# Patient Record
Sex: Male | Born: 1985 | Race: Black or African American | Hispanic: No | Marital: Single | Smoking: Current every day smoker
Health system: Southern US, Community
[De-identification: ages and names within clinical notes are randomized; demographics above are authoritative.]

---

## 2017-08-19 DIAGNOSIS — Z23 Encounter for immunization: Secondary | ICD-10-CM | POA: Diagnosis not present

## 2018-02-28 ENCOUNTER — Ambulatory Visit
Admission: EM | Admit: 2018-02-28 | Discharge: 2018-02-28 | Disposition: A | Discharge status: Home / Self Care | Payer: 59 | Attending: Family Medicine | Admitting: Family Medicine

## 2018-02-28 ENCOUNTER — Other Ambulatory Visit: Payer: Self-pay

## 2018-02-28 ENCOUNTER — Encounter: Payer: Self-pay | Admitting: Emergency Medicine

## 2018-02-28 DIAGNOSIS — R05 Cough: Secondary | ICD-10-CM | POA: Diagnosis not present

## 2018-02-28 DIAGNOSIS — Z8349 Family history of other endocrine, nutritional and metabolic diseases: Secondary | ICD-10-CM | POA: Diagnosis not present

## 2018-02-28 DIAGNOSIS — Z8249 Family history of ischemic heart disease and other diseases of the circulatory system: Secondary | ICD-10-CM | POA: Insufficient documentation

## 2018-02-28 DIAGNOSIS — E669 Obesity, unspecified: Secondary | ICD-10-CM | POA: Diagnosis not present

## 2018-02-28 DIAGNOSIS — R0789 Other chest pain: Secondary | ICD-10-CM | POA: Diagnosis not present

## 2018-02-28 DIAGNOSIS — F17200 Nicotine dependence, unspecified, uncomplicated: Secondary | ICD-10-CM | POA: Insufficient documentation

## 2018-02-28 DIAGNOSIS — F17210 Nicotine dependence, cigarettes, uncomplicated: Secondary | ICD-10-CM | POA: Diagnosis not present

## 2018-02-28 DIAGNOSIS — Z6836 Body mass index (BMI) 36.0-36.9, adult: Secondary | ICD-10-CM | POA: Insufficient documentation

## 2018-02-28 DIAGNOSIS — R079 Chest pain, unspecified: Secondary | ICD-10-CM | POA: Diagnosis present

## 2018-02-28 MED ORDER — IBUPROFEN 800 MG PO TABS: 800.0000 mg | ORAL_TABLET | Freq: Three times a day (TID) | ORAL | 0 refills | Status: DC

## 2018-02-28 MED ORDER — TRAMADOL HCL 50 MG PO TABS: 50.0000 mg | ORAL_TABLET | Freq: Three times a day (TID) | ORAL | 0 refills | Status: DC | PRN

## 2018-02-28 NOTE — ED Triage Notes (Signed)
Patient in today c/o cough x this morning and pain when he takes a deep breath. Patient also states he is having chest pain on the left side of chest that goes around to his back and shoulder. Patient denies nausea, light headedness or sob.

## 2018-02-28 NOTE — Discharge Instructions (Signed)
Medications as prescribed. ° °Rest. ° °Take care ° °Dr. Louvenia Golomb  °

## 2018-02-28 NOTE — ED Provider Notes (Signed)
[facility]    CSN: [id number] Arrival date & time: 02/28/18  0805  History   Chief Complaint Chief Complaint  Patient presents with  . Cough  . Chest Pain   HPI  32 year old male presents with the above complaints.  Patient states that he woke this morning and had chest pain and pain with deep inspiration.  He has a cough as well.  Patient works with [employer] and has recently done some heavy lifting.  He describes the pain as a pressure.  He also has pain around the left scapula.  No shortness of breath.  No fever.  No nausea, diaphoresis.  Worse with movement and deep inspiration.  Pain is moderate to severe.  No other associated symptoms.  No other complaints.  PMH - Obesity, Tobacco abuse.  Surgical Hx - No past surgeries.  Home Medications    Prior to Admission medications   Medication Sig Start Date End Date Taking? Authorizing Provider  ibuprofen (ADVIL,MOTRIN) 800 MG tablet Take 1 tablet (800 mg total) by mouth 3 (three) times daily. 02/28/18   Tommie Samsook, Nathaly Dawkins G, DO  traMADol (ULTRAM) 50 MG tablet Take 1 tablet (50 mg total) by mouth every 8 (eight) hours as needed. 02/28/18   Tommie Samsook, Clifton Safley G, DO   Family History Family History  Problem Relation Age of Onset  . Thyroid disease Mother   . Hypertension Father    Social History Social History   Tobacco Use  . Smoking status: Current Every Day Smoker  . Smokeless tobacco: Never Used  Substance Use Topics  . Alcohol use: Yes    Comment: socially  . Drug use: Never   Allergies   Patient has no known allergies.  Review of Systems Review of Systems  Constitutional: Negative.   Respiratory:       Pleuritic pain.  Cardiovascular: Positive for chest pain.    Physical Exam Triage Vital Signs ED Triage Vitals [02/28/18 0821]  Enc Vitals Group     BP (!) 138/101     Pulse Rate 75     Resp 16     Temp 98.2 F (36.8 C)     Temp Source Oral     SpO2 99 %     Weight (!) 307 lb (139.3 kg)     Height 6\' 5"   (1.956 m)     Head Circumference      Peak Flow      Pain Score 6     Pain Loc      Pain Edu?      Excl. in GC?    Updated Vital Signs BP (!) 138/101 (BP Location: Left Arm) Comment: lg cuff  Pulse 75   Temp 98.2 F (36.8 C) (Oral)   Resp 16   Ht 6\' 5"  (1.956 m)   Wt (!) 307 lb (139.3 kg)   SpO2 99%   BMI 36.40 kg/m   Physical Exam  Constitutional: He is oriented to person, place, and time. He appears well-developed. No distress.  HENT:  Head: Normocephalic and atraumatic.  Eyes: Conjunctivae are normal.  Cardiovascular: Normal rate and regular rhythm.  Pulmonary/Chest: Effort normal and breath sounds normal. He has no wheezes. He has no rales. He exhibits tenderness.  Musculoskeletal:  Patient with tenderness around the inferior border of the scapula.  Neurological: He is alert and oriented to person, place, and time.  Psychiatric: He has a normal mood and affect. His behavior is normal.  Nursing note and vitals reviewed.  UC Treatments / Results  Labs (all labs ordered are listed, but only abnormal results are displayed) Labs Reviewed - No data to display  EKG Interpretation: Normal sinus rhythm with rate of 71.  Normal axis.  Normal intervals.  No ST or T wave changes.  Normal EKG.  Radiology No results found.  Procedures Procedures (including critical care time)  Medications Ordered in UC Medications - No data to display   Initial Impression / Assessment and Plan / UC Course  I have reviewed the triage vital signs and the nursing notes.  Pertinent labs & imaging results that were available during my care of the patient were reviewed by me and considered in my medical decision making (see chart for details).     32 year old male presents with chest wall pain/MSK pain.  EKG normal.  Treating with ibuprofen and tramadol.  Final Clinical Impressions(s) / UC Diagnoses   Final diagnoses:  Chest wall pain    ED Discharge Orders        Ordered     ibuprofen (ADVIL,MOTRIN) 800 MG tablet  3 times daily     02/28/18 0845    traMADol (ULTRAM) 50 MG tablet  Every 8 hours PRN     02/28/18 0845      Controlled Substance Prescriptions [place] Controlled Substance Registry consulted? Not Applicable   Tommie Sams, [place] 02/28/18 [phone]

## 2018-05-23 ENCOUNTER — Ambulatory Visit
Admission: RE | Admit: 2018-05-23 | Discharge: 2018-05-23 | Disposition: A | Discharge status: Home / Self Care | Payer: No Typology Code available for payment source | Source: Ambulatory Visit | Attending: Physician Assistant | Admitting: Physician Assistant

## 2018-05-23 ENCOUNTER — Other Ambulatory Visit: Payer: Self-pay | Admitting: Physician Assistant

## 2018-05-23 DIAGNOSIS — M25572 Pain in left ankle and joints of left foot: Secondary | ICD-10-CM

## 2018-05-23 DIAGNOSIS — M7732 Calcaneal spur, left foot: Secondary | ICD-10-CM | POA: Diagnosis not present

## 2018-09-13 DIAGNOSIS — Z23 Encounter for immunization: Secondary | ICD-10-CM | POA: Diagnosis not present

## 2018-09-14 DIAGNOSIS — M545 Low back pain: Secondary | ICD-10-CM | POA: Diagnosis not present

## 2018-09-27 DIAGNOSIS — F17200 Nicotine dependence, unspecified, uncomplicated: Secondary | ICD-10-CM | POA: Diagnosis not present

## 2018-10-12 DIAGNOSIS — R2689 Other abnormalities of gait and mobility: Secondary | ICD-10-CM | POA: Diagnosis not present

## 2018-10-12 DIAGNOSIS — M545 Low back pain: Secondary | ICD-10-CM | POA: Diagnosis not present

## 2018-10-24 DIAGNOSIS — F17200 Nicotine dependence, unspecified, uncomplicated: Secondary | ICD-10-CM | POA: Diagnosis not present

## 2018-10-25 DIAGNOSIS — F17200 Nicotine dependence, unspecified, uncomplicated: Secondary | ICD-10-CM | POA: Diagnosis not present

## 2018-11-10 DIAGNOSIS — M48061 Spinal stenosis, lumbar region without neurogenic claudication: Secondary | ICD-10-CM | POA: Diagnosis not present

## 2018-11-10 DIAGNOSIS — M5126 Other intervertebral disc displacement, lumbar region: Secondary | ICD-10-CM | POA: Diagnosis not present

## 2018-11-10 DIAGNOSIS — K760 Fatty (change of) liver, not elsewhere classified: Secondary | ICD-10-CM | POA: Diagnosis not present

## 2018-11-11 DIAGNOSIS — R2689 Other abnormalities of gait and mobility: Secondary | ICD-10-CM | POA: Diagnosis not present

## 2018-12-11 DIAGNOSIS — R2689 Other abnormalities of gait and mobility: Secondary | ICD-10-CM | POA: Diagnosis not present

## 2019-01-10 DIAGNOSIS — R2689 Other abnormalities of gait and mobility: Secondary | ICD-10-CM | POA: Diagnosis not present

## 2019-02-09 DIAGNOSIS — R2689 Other abnormalities of gait and mobility: Secondary | ICD-10-CM | POA: Diagnosis not present

## 2019-03-12 DIAGNOSIS — R5383 Other fatigue: Secondary | ICD-10-CM | POA: Diagnosis not present

## 2019-07-20 ENCOUNTER — Other Ambulatory Visit: Payer: Self-pay

## 2019-07-20 DIAGNOSIS — Z20822 Contact with and (suspected) exposure to covid-19: Secondary | ICD-10-CM

## 2019-07-22 LAB — NOVEL CORONAVIRUS, NAA: SARS-CoV-2, NAA: NOT DETECTED

## 2019-08-01 ENCOUNTER — Other Ambulatory Visit: Payer: Self-pay

## 2019-08-01 DIAGNOSIS — Z20822 Contact with and (suspected) exposure to covid-19: Secondary | ICD-10-CM

## 2019-08-02 LAB — NOVEL CORONAVIRUS, NAA: SARS-CoV-2, NAA: NOT DETECTED

## 2019-09-19 IMAGING — CR DG FOOT COMPLETE 3+V*L*
3 series · 3 of 3 positions shown · non-contrast
Comparison: None.

CLINICAL DATA: 32-year-old who sustained a twisting injury to the
LEFT ankle while stepping on a rock. Pain, swelling and bruising to
the LATERAL malleolus and in the LATERAL foot. Initial encounter.

EXAM:
LEFT FOOT - COMPLETE 3+ VIEW

[foot ap]
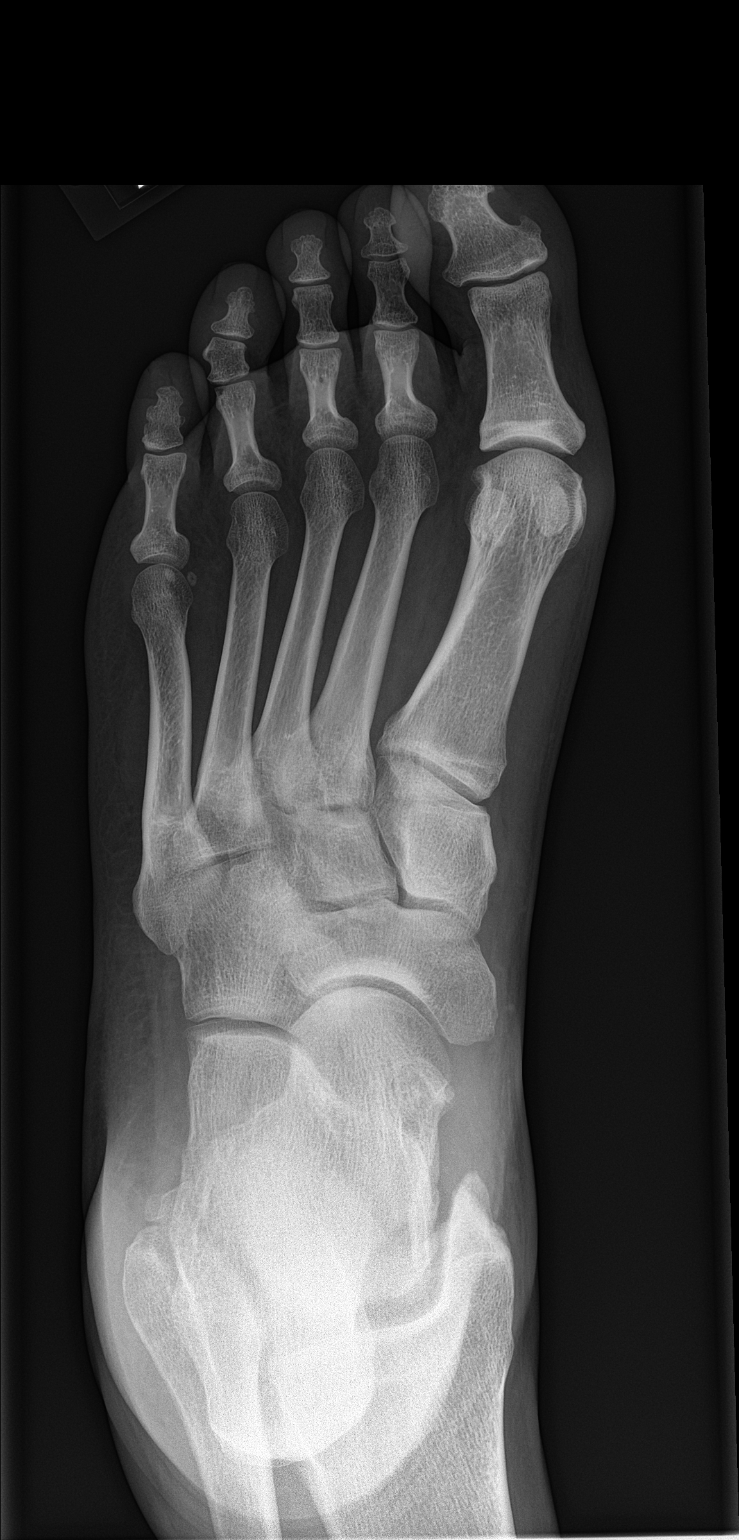

[foot obl]
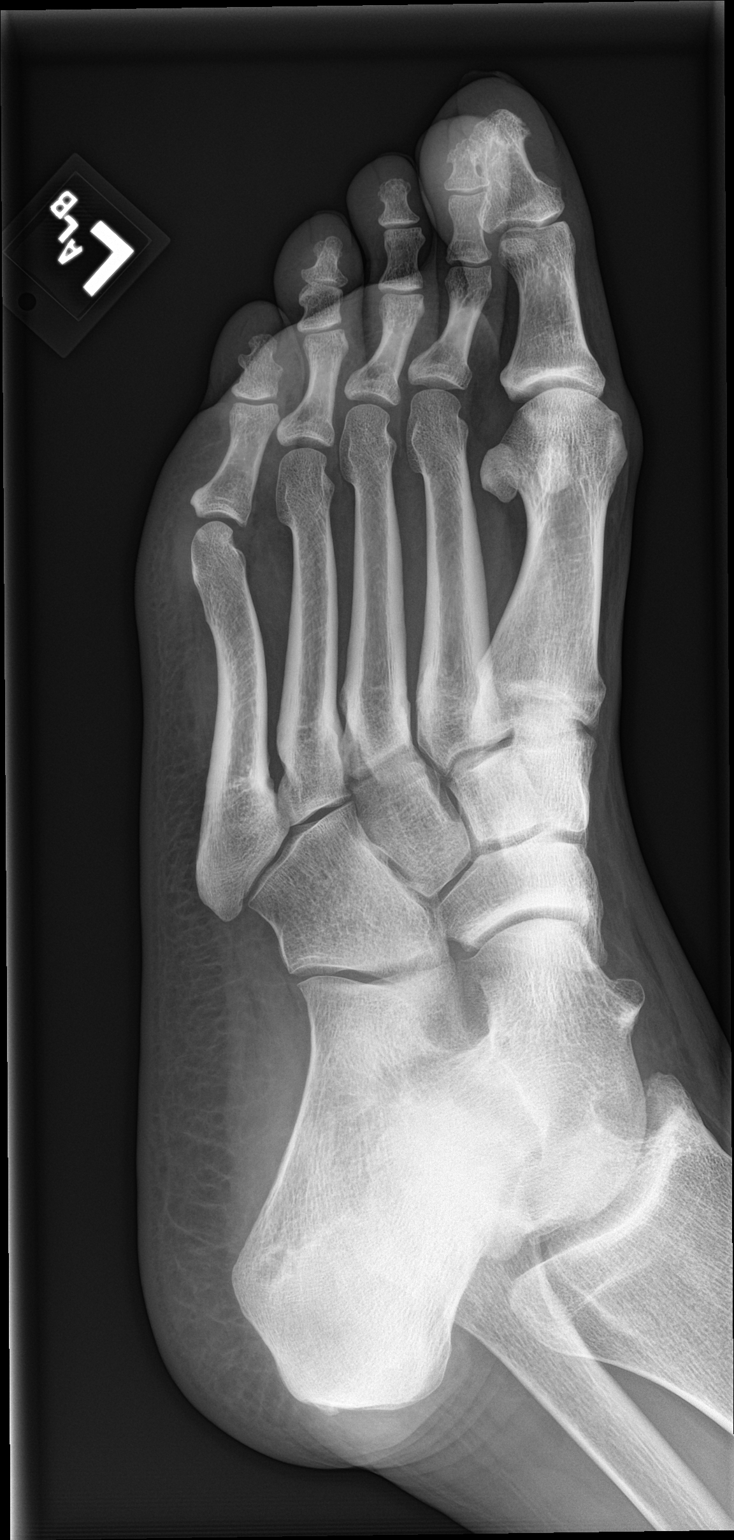

[foot lat]
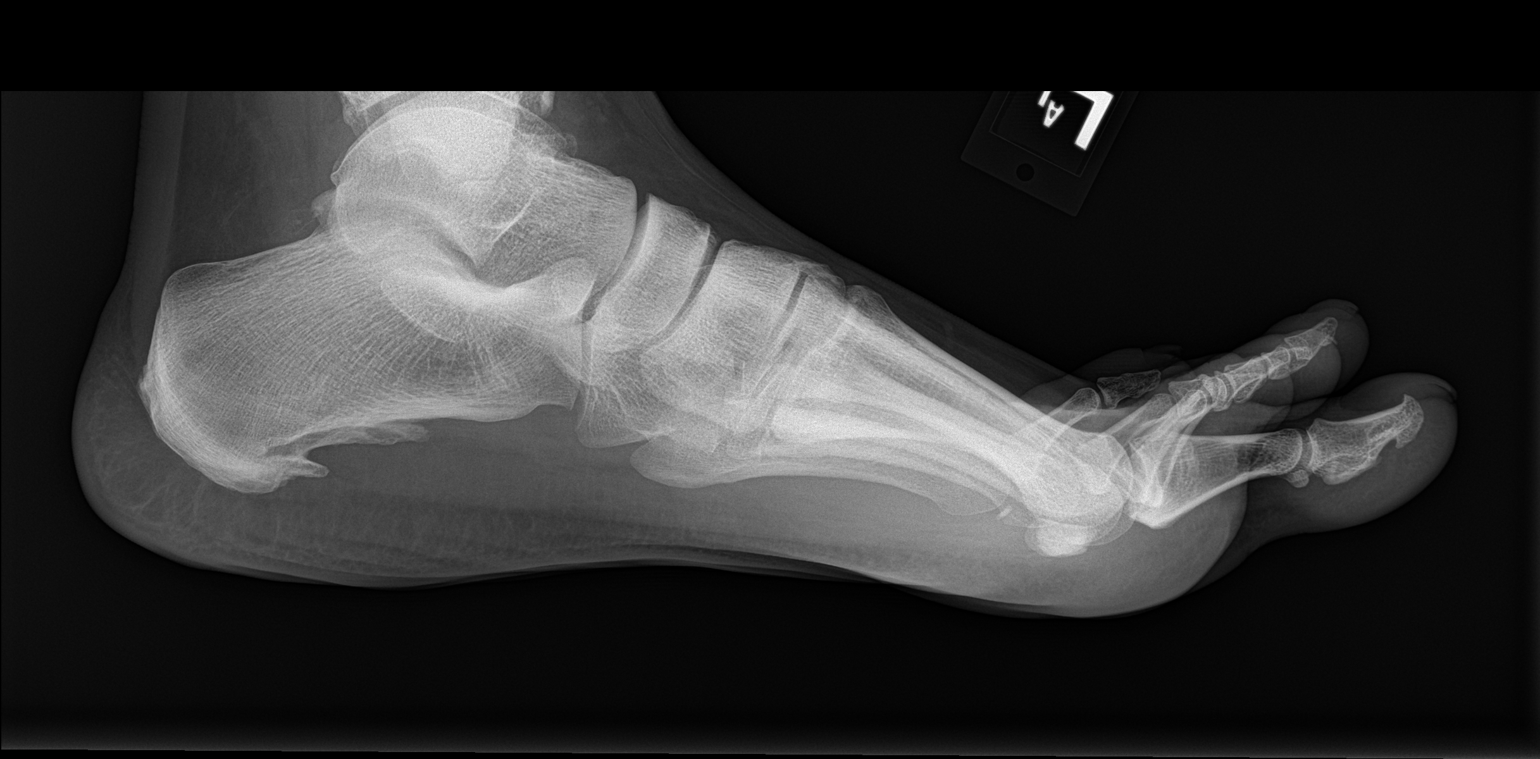

[3 of 3 positions shown; findings below may reference images not displayed]

FINDINGS: No evidence of acute fracture or dislocation. Well preserved joint
spaces. Well-preserved bone mineral density. Large plantar spur
arising from the calcaneus.
IMPRESSION: 1. No acute osseous abnormality.
2. Large plantar calcaneal spur.

## 2019-11-07 ENCOUNTER — Ambulatory Visit: Payer: 59 | Attending: Internal Medicine

## 2019-11-07 DIAGNOSIS — Z20822 Contact with and (suspected) exposure to covid-19: Secondary | ICD-10-CM

## 2019-11-08 LAB — NOVEL CORONAVIRUS, NAA: SARS-CoV-2, NAA: NOT DETECTED

## 2019-11-26 ENCOUNTER — Other Ambulatory Visit: Payer: 59

## 2019-11-28 ENCOUNTER — Ambulatory Visit: Payer: 59 | Attending: Internal Medicine

## 2019-11-28 DIAGNOSIS — Z20822 Contact with and (suspected) exposure to covid-19: Secondary | ICD-10-CM

## 2019-11-29 LAB — NOVEL CORONAVIRUS, NAA: SARS-CoV-2, NAA: NOT DETECTED

## 2020-01-14 ENCOUNTER — Ambulatory Visit: Payer: 59 | Attending: Internal Medicine

## 2020-01-14 DIAGNOSIS — Z20822 Contact with and (suspected) exposure to covid-19: Secondary | ICD-10-CM

## 2020-01-15 LAB — NOVEL CORONAVIRUS, NAA: SARS-CoV-2, NAA: NOT DETECTED

## 2021-09-02 ENCOUNTER — Ambulatory Visit
Admission: EM | Admit: 2021-09-02 | Discharge: 2021-09-02 | Disposition: A | Discharge status: Home / Self Care | Payer: BC Managed Care – PPO | Attending: Emergency Medicine | Admitting: Emergency Medicine

## 2021-09-02 ENCOUNTER — Other Ambulatory Visit: Payer: Self-pay

## 2021-09-02 ENCOUNTER — Encounter: Payer: Self-pay | Admitting: Emergency Medicine

## 2021-09-02 DIAGNOSIS — J029 Acute pharyngitis, unspecified: Secondary | ICD-10-CM | POA: Diagnosis not present

## 2021-09-02 DIAGNOSIS — M940 Chondrocostal junction syndrome [Tietze]: Secondary | ICD-10-CM | POA: Insufficient documentation

## 2021-09-02 DIAGNOSIS — L0390 Cellulitis, unspecified: Secondary | ICD-10-CM | POA: Insufficient documentation

## 2021-09-02 DIAGNOSIS — L02419 Cutaneous abscess of limb, unspecified: Secondary | ICD-10-CM | POA: Insufficient documentation

## 2021-09-02 DIAGNOSIS — Z0289 Encounter for other administrative examinations: Secondary | ICD-10-CM | POA: Insufficient documentation

## 2021-09-02 DIAGNOSIS — M779 Enthesopathy, unspecified: Secondary | ICD-10-CM | POA: Insufficient documentation

## 2021-09-02 DIAGNOSIS — F1020 Alcohol dependence, uncomplicated: Secondary | ICD-10-CM | POA: Insufficient documentation

## 2021-09-02 DIAGNOSIS — J329 Chronic sinusitis, unspecified: Secondary | ICD-10-CM | POA: Insufficient documentation

## 2021-09-02 DIAGNOSIS — K5900 Constipation, unspecified: Secondary | ICD-10-CM | POA: Insufficient documentation

## 2021-09-02 DIAGNOSIS — M222X9 Patellofemoral disorders, unspecified knee: Secondary | ICD-10-CM | POA: Insufficient documentation

## 2021-09-02 DIAGNOSIS — F17200 Nicotine dependence, unspecified, uncomplicated: Secondary | ICD-10-CM | POA: Insufficient documentation

## 2021-09-02 DIAGNOSIS — K602 Anal fissure, unspecified: Secondary | ICD-10-CM | POA: Insufficient documentation

## 2021-09-02 DIAGNOSIS — N451 Epididymitis: Secondary | ICD-10-CM | POA: Insufficient documentation

## 2021-09-02 DIAGNOSIS — F1210 Cannabis abuse, uncomplicated: Secondary | ICD-10-CM | POA: Insufficient documentation

## 2021-09-02 LAB — POCT RAPID STREP A (OFFICE): Rapid Strep A Screen: NEGATIVE

## 2021-09-02 NOTE — Discharge Instructions (Addendum)
Your strep test is negative.    Your COVID test is pending.  You should self quarantine until the test result is back.    Take Tylenol or ibuprofen as needed for fever or discomfort.  Rest and keep yourself hydrated.    Follow-up with your primary care provider if your symptoms are not improving.     

## 2021-09-02 NOTE — ED Provider Notes (Signed)
Douglas Mack    CSN: [id number] Arrival date & time: 09/02/21  1542      History   Chief Complaint Chief Complaint  Patient presents with   Sore Throat    HPI Douglas Mack is a 35 y.o. male.  Patient presents with left-sided sore throat since this morning.  He denies fever, chills, rash, ear pain, difficulty swallowing, voice change, cough, shortness of breath, or other symptoms.  No treatments attempted at home.   The history is provided by the patient and medical records.   History reviewed. No pertinent past medical history.  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 No pertinent surgical history.     Home Medications    Prior to Admission medications   Medication Sig Start Date End Date Taking? Authorizing Provider  ibuprofen (ADVIL,MOTRIN) 800 MG tablet Take 1 tablet (800 mg total) by mouth 3 (three) times daily. 02/28/18   Tommie Sams, DO  traMADol (ULTRAM) 50 MG tablet Take 1 tablet (50 mg total) by mouth every 8 (eight) hours as needed. 02/28/18   Tommie Sams, DO    Family History Family History  Problem Relation Age of Onset   Thyroid disease Mother    Hypertension Father     Social History Social History   Tobacco Use   Smoking status: Every Day   Smokeless tobacco: Never  Vaping Use   Vaping Use: Never used  Substance Use Topics   Alcohol use: Yes    Comment: socially   Drug use: Never     Allergies   Patient has no known allergies.   Review of Systems Review of Systems  Constitutional:  Negative for  chills and fever.  HENT:  Positive for sore throat. Negative for ear pain.   Respiratory:  Negative for cough and shortness of breath.   Cardiovascular:  Negative for chest pain and palpitations.  Gastrointestinal:  Negative for abdominal pain, diarrhea and vomiting.  Skin:  Negative for color change and rash.  All other systems reviewed and are negative.   Physical Exam Triage Vital Signs ED Triage Vitals  Enc Vitals Group     BP      Pulse      Resp      Temp      Temp src      SpO2      Weight      Height      Head Circumference      Peak Flow      Pain Score      Pain Loc      Pain Edu?      Excl. in GC?    No data found.  Updated Vital Signs BP (!) 128/94 (BP Location: Left Arm)   Pulse 88   Temp 97.9 F (36.6 C)   Resp 18   SpO2 95%   Visual Acuity Right Eye Distance:   Left Eye Distance:   Bilateral Distance:    Right Eye Near:   Left Eye Near:    Bilateral Near:     Physical  Exam Vitals and nursing note reviewed.  Constitutional:      General: He is not in acute distress.    Appearance: He is well-developed. He is not ill-appearing.  HENT:     Head: Normocephalic and atraumatic.     Right Ear: Tympanic membrane normal.     Left Ear: Tympanic membrane normal.     Nose: Nose normal.     Mouth/Throat:     Mouth: Mucous membranes are moist.     Pharynx: Posterior oropharyngeal erythema present.  Eyes:     Conjunctiva/sclera: Conjunctivae normal.  Cardiovascular:     Rate and Rhythm: Normal rate and regular rhythm.     Heart sounds: Normal heart sounds.  Pulmonary:     Effort: Pulmonary effort is normal. No respiratory distress.     Breath sounds: Normal breath sounds.  Abdominal:     Palpations: Abdomen is soft.     Tenderness: There is no abdominal tenderness.  Musculoskeletal:     Cervical back: Neck supple.  Skin:    General: Skin is warm and dry.  Neurological:     General: No focal deficit present.     Mental Status: He is alert  and oriented to person, place, and time.     Gait: Gait normal.  Psychiatric:        Mood and Affect: Mood normal.        Behavior: Behavior normal.     UC Treatments / Results  Labs (all labs ordered are listed, but only abnormal results are displayed) Labs Reviewed  POCT RAPID STREP A (OFFICE) - Normal  NOVEL CORONAVIRUS, NAA    EKG   Radiology No results found.  Procedures Procedures (including critical care time)  Medications Ordered in UC Medications - No data to display  Initial Impression / Assessment and Plan / UC Course  I have reviewed the triage vital signs and the nursing notes.  Pertinent labs & imaging results that were available during my care of the patient were reviewed by me and considered in my medical decision making (see chart for details).   Sore throat.  Rapid strep negative.  COVID pending.  Instructed patient to self quarantine per CDC guidelines.  Discussed symptomatic treatment including Tylenol or ibuprofen, rest, hydration.  Instructed patient to follow up with PCP if symptoms are not improving.  Patient agrees to plan of care.    Final Clinical Impressions(s) / UC Diagnoses   Final diagnoses:  Sore throat     Discharge Instructions      Your strep test is negative.    Your COVID test is pending.  You should self quarantine until the test result is back.    Take Tylenol or ibuprofen as needed for fever or discomfort.  Rest and keep yourself hydrated.    Follow-up with your primary care provider if your symptoms are not improving.         ED Prescriptions   None    PDMP not reviewed this encounter.   Mickie Bail, NP 09/02/21 [phone]

## 2021-09-02 NOTE — ED Triage Notes (Signed)
Pt here with sore throat just on the left side since this morning.

## 2021-09-03 LAB — NOVEL CORONAVIRUS, NAA: SARS-CoV-2, NAA: NOT DETECTED

## 2021-09-03 LAB — SARS-COV-2, NAA 2 DAY TAT

## 2021-11-24 DIAGNOSIS — Z20822 Contact with and (suspected) exposure to covid-19: Secondary | ICD-10-CM | POA: Diagnosis not present

## 2021-11-24 DIAGNOSIS — U071 COVID-19: Secondary | ICD-10-CM | POA: Diagnosis not present

## 2022-01-12 DIAGNOSIS — R109 Unspecified abdominal pain: Secondary | ICD-10-CM | POA: Diagnosis not present

## 2022-01-12 DIAGNOSIS — K529 Noninfective gastroenteritis and colitis, unspecified: Secondary | ICD-10-CM | POA: Diagnosis not present

## 2022-01-12 DIAGNOSIS — R1032 Left lower quadrant pain: Secondary | ICD-10-CM | POA: Diagnosis not present

## 2022-01-12 DIAGNOSIS — N3000 Acute cystitis without hematuria: Secondary | ICD-10-CM | POA: Diagnosis not present

## 2022-01-28 DIAGNOSIS — Z9852 Vasectomy status: Secondary | ICD-10-CM | POA: Diagnosis not present

## 2022-03-01 DIAGNOSIS — N201 Calculus of ureter: Secondary | ICD-10-CM | POA: Diagnosis not present

## 2022-03-01 DIAGNOSIS — R319 Hematuria, unspecified: Secondary | ICD-10-CM | POA: Diagnosis not present

## 2022-03-26 ENCOUNTER — Inpatient Hospital Stay: Admission: RE | Admit: 2022-03-26 | Payer: Self-pay | Source: Ambulatory Visit

## 2022-03-27 ENCOUNTER — Ambulatory Visit
Admission: EM | Admit: 2022-03-27 | Discharge: 2022-03-27 | Disposition: A | Discharge status: Home / Self Care | Payer: BC Managed Care – PPO | Attending: Emergency Medicine | Admitting: Emergency Medicine

## 2022-03-27 DIAGNOSIS — R109 Unspecified abdominal pain: Secondary | ICD-10-CM | POA: Insufficient documentation

## 2022-03-27 DIAGNOSIS — R319 Hematuria, unspecified: Secondary | ICD-10-CM | POA: Diagnosis not present

## 2022-03-27 LAB — URINALYSIS, MICROSCOPIC (REFLEX)
Squamous Epithelial / LPF: NONE SEEN (ref 0–5)
WBC, UA: NONE SEEN WBC/hpf (ref 0–5)

## 2022-03-27 LAB — URINALYSIS, ROUTINE W REFLEX MICROSCOPIC
Bilirubin Urine: NEGATIVE
Glucose, UA: NEGATIVE mg/dL
Ketones, ur: NEGATIVE mg/dL
Leukocytes,Ua: NEGATIVE
Nitrite: NEGATIVE
Protein, ur: NEGATIVE mg/dL
Specific Gravity, Urine: 1.02 (ref 1.005–1.030)
pH: 7.5 (ref 5.0–8.0)

## 2022-03-27 MED ORDER — TAMSULOSIN HCL 0.4 MG PO CAPS: 0.4000 mg | ORAL_CAPSULE | Freq: Every day | ORAL | 0 refills | Status: DC

## 2022-03-27 MED ORDER — TRAMADOL HCL 50 MG PO TABS: 50.0000 mg | ORAL_TABLET | Freq: Four times a day (QID) | ORAL | 0 refills | Status: DC | PRN

## 2022-03-27 NOTE — ED Triage Notes (Signed)
Patient is here for "Left lower back pain". No injury known. Pain "radiates to leg if sitting down to long". Started hurting "about 3 wks ago". Yesterday and today have been the worst. 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

## 2022-03-27 NOTE — Discharge Instructions (Addendum)
Your urinalysis today did not show any signs of infection but it did show blood which could be indicative of a kidney stone.  Given your left flank pain that radiates around to your left groin I am concerned that you are currently passing a kidney stone.  Strain all your urine to see if you can collect any stones that might pass.  Increase oral fluid intake so that she increased urine production to help push any stones along so that they can clear your system.  Take the tamsulosin daily to help increase the size of your ureter so it makes it easier for the stone to pass.  Use over-the-counter Tylenol and ibuprofen according to the package instructions as needed for mild to moderate pain and take the tramadol as needed for severe pain.  If you have any continued or worsening symptoms please return for reevaluation or see your primary care provider.

## 2022-03-27 NOTE — ED Provider Notes (Signed)
[facility]    CSN: [id number] Arrival date & time: 03/27/22  1407      History   Chief Complaint Chief Complaint  Patient presents with   Back Pain    Left Lower    HPI Douglas Mack is a 36 y.o. male.   HPI  36 year old male here for evaluation of left leg pain.  Patient reports that he has been experiencing pain in his left flank that radiates around to his left groin for the past 3 weeks.  He states 3 weeks ago when his symptoms started he noticed some blood when he urinated and intermittently has seen some blood in his underwear but has not seen any more blood in his urine.  Patient did give a urine sample at triage and reports that he did see blood in the urine and also complained of some mild burning with urination.  He denies any injury or heavy lifting.  No urgency or frequency of urination.  He does not have any history of kidney stones.  No numbness or tingling or weakness in his left lower extremity.  The pain does not radiate into his buttock.  He denies any sweating or nausea.  He states that he does not drink a lot of fluid throughout the day.  The pain is worse if he has been sitting for long period of time.  History reviewed. No pertinent past medical history.  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 No pertinent surgical history.     Home Medications    Prior to Admission medications   Medication Sig Start Date End Date Taking? Authorizing Provider  naproxen (NAPROSYN) 500 MG tablet Take 500 mg by mouth 2 (two) times daily. 01/12/22   Yes [provider]  tamsulosin (FLOMAX) 0.4 MG CAPS capsule Take 1 capsule (0.4 mg total) by mouth daily. 03/27/22  Yes Becky Augustayan, Chasen Mendell, NP  traMADol (ULTRAM) 50 MG tablet Take 1 tablet (50 mg total) by mouth every 6 (six) hours as needed. 03/27/22  Yes Becky Augustayan, [place], NP  ibuprofen (ADVIL,MOTRIN) 800 MG tablet Take 1 tablet (800 mg total) by mouth 3 (three) times daily. 02/28/18   Tommie Samsook, Jayce G, DO    Family History Family History  Problem Relation Age of Onset   Thyroid disease Mother    Hypertension Father     Social History Social History   Tobacco Use   Smoking status: Former    Types: Cigarettes   Smokeless tobacco: Never  Vaping Use   Vaping Use: Some days  Substance Use Topics   Alcohol use: Yes    Comment: socially   Drug use: Never     Allergies   Patient has no known allergies.   Review of Systems Review of Systems  Genitourinary:  Positive for dysuria, flank pain and hematuria. Negative for frequency, testicular pain and urgency.  Musculoskeletal:  Positive for back pain.  Skin:  Negative for rash.  Hematological: Negative.   Psychiatric/Behavioral: Negative.      Physical Exam Triage Vital Signs ED Triage Vitals  Enc Vitals Group  BP 03/27/22 1420 138/87     Pulse Rate 03/27/22 1420 83     Resp 03/27/22 1420 20     Temp 03/27/22 1420 98 F (36.7 C)     Temp Source 03/27/22 1420 Oral     SpO2 03/27/22 1420 100 %     Weight 03/27/22 1416 (!) 313 lb (142 kg)     Height 03/27/22 1416 6\' 5"  (1.956 m)     Head Circumference --      Peak Flow --      Pain Score 03/27/22 1413 6     Pain Loc --      Pain Edu? --      Excl. in [place]? --    No data found.  Updated Vital Signs BP 138/87 (BP Location: Left Arm)   Pulse 83   Temp 98 F (36.7 C) (Oral)   Resp 20   Ht 6\' 5"  (1.956 m)   Wt (!) 313 lb (142 kg)   SpO2 100%   BMI 37.12 kg/m   Visual Acuity Right Eye Distance:   Left Eye Distance:   Bilateral Distance:    Right Eye Near:    Left Eye Near:    Bilateral Near:     Physical Exam Vitals and nursing note reviewed.  Constitutional:      Appearance: Normal appearance. He is not ill-appearing.  HENT:     Head: Normocephalic and atraumatic.  Cardiovascular:     Rate and Rhythm: Normal rate and regular rhythm.     Pulses: Normal pulses.     Heart sounds: Normal heart sounds. No murmur heard.   No friction rub. No gallop.  Pulmonary:     Effort: Pulmonary effort is normal.     Breath sounds: Normal breath sounds. No wheezing, rhonchi or rales.  Abdominal:     General: Abdomen is flat. Bowel sounds are normal.     Palpations: Abdomen is soft.     Tenderness: There is abdominal tenderness. There is no right CVA tenderness, left CVA tenderness, guarding or rebound.  Skin:    General: Skin is warm and dry.     Capillary Refill: Capillary refill takes less than 2 seconds.     Findings: No erythema or rash.  Neurological:     General: No focal deficit present.     Mental Status: He is alert and oriented to person, place, and time.  Psychiatric:        Mood and Affect: Mood normal.        Behavior: Behavior normal.        Thought Content: Thought content normal.        Judgment: Judgment normal.     UC Treatments / Results  Labs (all labs ordered are listed, but only abnormal results are displayed) Labs Reviewed  URINALYSIS, ROUTINE W REFLEX MICROSCOPIC - Abnormal; Notable for the following components:      Result Value   Hgb urine dipstick SMALL (*)    All other components within normal limits  URINALYSIS, MICROSCOPIC (REFLEX) - Abnormal; Notable for the following components:   Bacteria, UA RARE (*)    All other components within normal limits    EKG   Radiology No results found.  Procedures Procedures (including critical care time)  Medications Ordered in UC Medications - No data to display  Initial Impression / Assessment and Plan / UC Course  I have reviewed the triage vital signs and  the nursing notes.  Pertinent labs &  imaging results that were available during my care of the patient were reviewed by me and considered in my medical decision making (see chart for details).  Patient is a pleasant, nontoxic-appearing 36 year old male here for evaluation of left flank pain with radiation into the left groin that is been going on for the past 3 weeks.  He denies the pain radiating into his testicle.  He has had intermittent hematuria for last 3 weeks.  He denies any heavy lifting or injury.  He does not have a history of kidney stones.  He does endorse that he had some pain with urination when he gave a sample here in clinic but he has not had any dysuria, urgency, or frequency over the past 3 weeks.  On exam patient is a benign cardiopulmonary exam with S1-S2 heart sounds with regular rate and rhythm and lung sounds that are clear to auscultation fields.  There is no midline spinous tenderness.  Patient does have some mild left lateral lumbar/left flank tenderness but no spasm or muscle tension appreciated.  Abdomen is protuberant but soft.  Patient does have some mild tenderness with palpation in the left inguinal region of the abdomen but no pain with palpation of the left hip joint.  No pain on palpation to left buttock either.  I am concerned that patient may be passing a kidney stone.  Will check UA for presence of blood or infection.  Urinalysis shows small hemoglobin but is negative for protein, nitrites, or leukocyte esterase.  Reflex microscopy shows 21-50 RBCs and rare bacteria.  No WBCs or squamous epithelial seen.  Given patient's hematuria and left flank pain I will treat the patient for suspected left kidney stone.  I will discharge him home with tamsulosin to take daily, instructed to increase his oral fluid intake to help flush his system and help with body to clear any potential kidney stones.  I will have him strain his urine with each void to look for potential stones.   If his symptoms do not get any better, or they worsen, he should return for reevaluation or see his primary care provider.  Patient home with some tramadol he can use for severe pain.  PDMP consulted and patient has no open narcotic scripts.   Final Clinical Impressions(s) / UC Diagnoses   Final diagnoses:  Left flank pain  Hematuria, unspecified type     Discharge Instructions      Your urinalysis today did not show any signs of infection but it did show blood which could be indicative of a kidney stone.  Given your left flank pain that radiates around to your left groin I am concerned that you are currently passing a kidney stone.  Strain all your urine to see if you can collect any stones that might pass.  Increase oral fluid intake so that she increased urine production to help push any stones along so that they can clear your system.  Take the tamsulosin daily to help increase the size of your ureter so it makes it easier for the stone to pass.  Use over-the-counter Tylenol and ibuprofen according to the package instructions as needed for mild to moderate pain and take the tramadol as needed for severe pain.  If you have any continued or worsening symptoms please return for reevaluation or see your primary care provider.     ED Prescriptions     Medication Sig Dispense Auth. Provider   tamsulosin (FLOMAX) 0.4 MG CAPS capsule Take 1 capsule (  0.4 mg total) by mouth daily. 30 capsule Margarette Canada, NP   traMADol (ULTRAM) 50 MG tablet Take 1 tablet (50 mg total) by mouth every 6 (six) hours as needed. 15 tablet Margarette Canada, NP      I have reviewed the PDMP during this encounter.   Margarette Canada, NP 03/27/22 1510

## 2022-08-27 NOTE — Progress Notes (Unsigned)
   There were no vitals taken for this visit.   Subjective:    Patient ID: Douglas Mack, male    DOB: Mar 17, 1986, 36 y.o.   MRN: [id number]  HPI: Douglas Mack is a 36 y.o. male  No chief complaint on file.  Patient presents to clinic to establish care with new PCP.  Introduced to Designer, jewellery role and practice setting.  All questions answered.  Discussed provider/patient relationship and expectations.  Patient reports a history of ***. Patient denies a history of: Hypertension, Elevated Cholesterol, Diabetes, Thyroid problems, Depression, Anxiety, Neurological problems, and Abdominal problems.   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 Family History  Problem Relation Age of Onset   Thyroid disease Mother    Hypertension Father      Review of Systems  Per HPI unless specifically indicated above     Objective:    There were no vitals taken for this visit.  Wt Readings from Last 3 Encounters:  03/27/22 (!) 313 lb (142 kg)  02/28/18 (!) 307 lb (139.3 kg)    Physical Exam  Results for orders placed or performed during the hospital encounter of 03/27/22  Urinalysis, Routine w reflex microscopic Urine, Clean Catch  Result Value Ref Range   Color, Urine YELLOW YELLOW   APPearance CLEAR CLEAR   Specific Gravity, Urine 1.020 1.005 - 1.030   pH 7.5 5.0 - 8.0   Glucose, UA NEGATIVE NEGATIVE mg/dL   Hgb urine  dipstick SMALL (A) NEGATIVE   Bilirubin Urine NEGATIVE NEGATIVE   Ketones, ur NEGATIVE NEGATIVE mg/dL   Protein, ur NEGATIVE NEGATIVE mg/dL   Nitrite NEGATIVE NEGATIVE   Leukocytes,Ua NEGATIVE NEGATIVE  Urinalysis, Microscopic (reflex)  Result Value Ref Range   RBC / HPF 21-50 0 - 5 RBC/hpf   WBC, UA NONE SEEN 0 - 5 WBC/hpf   Bacteria, UA RARE (A) NONE SEEN   Squamous Epithelial / LPF NONE SEEN 0 - 5      Assessment & Plan:   Problem List Items Addressed This Visit   None Visit Diagnoses     Encounter to establish care    -  Primary        Follow up plan: No follow-ups on file.

## 2022-08-30 ENCOUNTER — Encounter: Payer: Self-pay | Admitting: Nurse Practitioner

## 2022-08-30 ENCOUNTER — Ambulatory Visit: Payer: BC Managed Care – PPO | Admitting: Nurse Practitioner

## 2022-08-30 VITALS — BP 130/88 | HR 87 | Temp 98.6°F | Ht 75.98 in | Wt 314.7 lb

## 2022-08-30 DIAGNOSIS — F419 Anxiety disorder, unspecified: Secondary | ICD-10-CM | POA: Insufficient documentation

## 2022-08-30 DIAGNOSIS — Z7689 Persons encountering health services in other specified circumstances: Secondary | ICD-10-CM

## 2022-08-30 DIAGNOSIS — M5442 Lumbago with sciatica, left side: Secondary | ICD-10-CM

## 2022-08-30 DIAGNOSIS — M5441 Lumbago with sciatica, right side: Secondary | ICD-10-CM

## 2022-08-30 DIAGNOSIS — G8929 Other chronic pain: Secondary | ICD-10-CM | POA: Insufficient documentation

## 2022-08-30 DIAGNOSIS — F339 Major depressive disorder, recurrent, unspecified: Secondary | ICD-10-CM

## 2022-08-30 MED ORDER — CYCLOBENZAPRINE HCL 10 MG PO TABS: 10.0000 mg | ORAL_TABLET | Freq: Every day | ORAL | 0 refills | Status: AC

## 2022-08-30 NOTE — Assessment & Plan Note (Signed)
Chronic. Not well controlled.  Patient does not want to discuss medication.  He is not interested in therapy at this time.  Denies SI.  Discussed with patient that if he changes his mind we can discuss these options at anytime.  

## 2022-08-30 NOTE — Assessment & Plan Note (Signed)
Chronic. Ongoing since 2006-2007.  Suspect muscle spasms and possible sciatica.  Will start cyclobenzaprine daily for 30 days.  Side effects and benefits of medication discussed during visit.  Xray of lumbar spine ordered.  Follow up in 1 month.  Call sooner if concerns arise.

## 2022-08-30 NOTE — Assessment & Plan Note (Signed)
Chronic. Not well controlled.  Patient does not want to discuss medication.  He is not interested in therapy at this time.  Denies SI.  Discussed with patient that if he changes his mind we can discuss these options at anytime.

## 2022-09-13 ENCOUNTER — Telehealth: Payer: Self-pay | Admitting: Nurse Practitioner

## 2022-09-13 NOTE — Telephone Encounter (Signed)
Pt states last time he saw Santiago Glad she asked if he wanted her to prescribe him a depression medication,. At that time he said no.  But pt has changed his mind, and would like Santiago Glad to prescribe him something for depression.  [id number] - [place], [place] - [address]

## 2022-09-13 NOTE — Telephone Encounter (Signed)
Patient has been scheduled for office  visit 09/14/2022 at 240 PM.

## 2022-09-13 NOTE — Telephone Encounter (Signed)
Patient has to have an appt.

## 2022-09-14 ENCOUNTER — Ambulatory Visit: Payer: BC Managed Care – PPO | Admitting: Nurse Practitioner

## 2022-09-14 ENCOUNTER — Encounter: Payer: Self-pay | Admitting: Nurse Practitioner

## 2022-09-14 VITALS — BP 133/85 | HR 87 | Temp 98.5°F | Wt 316.1 lb

## 2022-09-14 DIAGNOSIS — F419 Anxiety disorder, unspecified: Secondary | ICD-10-CM

## 2022-09-14 DIAGNOSIS — F339 Major depressive disorder, recurrent, unspecified: Secondary | ICD-10-CM

## 2022-09-14 DIAGNOSIS — G479 Sleep disorder, unspecified: Secondary | ICD-10-CM | POA: Diagnosis not present

## 2022-09-14 MED ORDER — VENLAFAXINE HCL ER 37.5 MG PO CP24: 37.5000 mg | ORAL_CAPSULE | Freq: Every day | ORAL | 0 refills | Status: DC

## 2022-09-14 MED ORDER — TRAZODONE HCL 50 MG PO TABS: 25.0000 mg | ORAL_TABLET | Freq: Every evening | ORAL | 0 refills | Status: DC | PRN

## 2022-09-14 NOTE — Progress Notes (Signed)
BP 133/85   Pulse 87   Temp 98.5 F (36.9 C) (Oral)   Wt (!) 316 lb 1.6 oz (143.4 kg)   SpO2 98%   BMI 38.49 kg/m    Subjective:    Patient ID: Douglas Mack, male    DOB: 04-Aug-1986, 36 y.o.   MRN: [id number]  HPI: Douglas Mack is a 36 y.o. male  Chief Complaint  Patient presents with   Depression    Patient would like to discuss getting on antidepressant medications.    DEPRESSION Patient states he is ready to start medication. Patient feels like the past couple of weeks he hasn't been able to get out of bed.  Feels blah and very irritable.  Patient states he is he is not having thoughts of harming himself.  However, he does feel like his family would be better off without him.  He is having trouble performing his work duties.  Has a lot of anxiety and is very jumpy during social situations.  [facility] Visit from 09/14/2022 in [place]  PHQ-9 Total Score 24         09/14/2022    2:45 PM 08/30/2022    9:11 AM  GAD 7 : Generalized Anxiety Score  Nervous, Anxious, on Edge 3 3  Control/stop worrying 3 3  Worry too much - different things 3 3  Trouble relaxing 3 3  Restless 3 3  Easily annoyed or irritable 3 3  Afraid - awful might happen 3 3  Total GAD 7 Score 21 21  Anxiety Difficulty Extremely difficult Extremely difficult       Relevant past medical, surgical, family and social history reviewed and updated as indicated. Interim medical history since our last visit reviewed. Allergies and medications reviewed and updated.  Review of Systems  Psychiatric/Behavioral:  Positive for dysphoric mood and sleep disturbance. Negative for suicidal ideas. The patient is nervous/anxious.     Per HPI unless specifically indicated above     Objective:    BP 133/85   Pulse 87   Temp 98.5 F (36.9 C) (Oral)   Wt (!) 316 lb 1.6 oz (143.4 kg)   SpO2 98%   BMI 38.49 kg/m   Wt Readings from Last 3 Encounters:  09/14/22 (!) 316 lb 1.6  oz (143.4 kg)  08/30/22 (!) 314 lb 11.2 oz (142.7 kg)  03/27/22 (!) 313 lb (142 kg)    Physical Exam Vitals and nursing note reviewed.  Constitutional:      General: He is not in acute distress.    Appearance: Normal appearance. He is obese. He is not ill-appearing, toxic-appearing or diaphoretic.  HENT:     Head: Normocephalic.     Right Ear: External ear normal.     Left Ear: External ear normal.     Nose: Nose normal. No congestion or rhinorrhea.     Mouth/Throat:     Mouth: Mucous membranes are moist.  Eyes:     General:        Right eye: No discharge.        Left eye: No discharge.     Extraocular Movements: Extraocular movements intact.     Conjunctiva/sclera: Conjunctivae normal.     Pupils: Pupils are equal, round, and reactive to light.  Cardiovascular:     Rate and Rhythm: Normal rate and regular rhythm.     Heart sounds: No murmur heard. Pulmonary:     Effort: Pulmonary effort is normal. No respiratory  distress.     Breath sounds: Normal breath sounds. No wheezing, rhonchi or rales.  Abdominal:     General: Abdomen is flat. Bowel sounds are normal.  Musculoskeletal:     Cervical back: Normal range of motion and neck supple.  Skin:    General: Skin is warm and dry.     Capillary Refill: Capillary refill takes less than 2 seconds.  Neurological:     General: No focal deficit present.     Mental Status: He is alert and oriented to person, place, and time.  Psychiatric:        Mood and Affect: Mood normal.        Behavior: Behavior normal.        Thought Content: Thought content normal.        Judgment: Judgment normal.     Results for orders placed or performed during the hospital encounter of 03/27/22  Urinalysis, Routine w reflex microscopic Urine, Clean Catch  Result Value Ref Range   Color, Urine YELLOW YELLOW   APPearance CLEAR CLEAR   Specific Gravity, Urine 1.020 1.005 - 1.030   pH 7.5 5.0 - 8.0   Glucose, UA NEGATIVE NEGATIVE mg/dL   Hgb urine  dipstick SMALL (A) NEGATIVE   Bilirubin Urine NEGATIVE NEGATIVE   Ketones, ur NEGATIVE NEGATIVE mg/dL   Protein, ur NEGATIVE NEGATIVE mg/dL   Nitrite NEGATIVE NEGATIVE   Leukocytes,Ua NEGATIVE NEGATIVE  Urinalysis, Microscopic (reflex)  Result Value Ref Range   RBC / HPF 21-50 0 - 5 RBC/hpf   WBC, UA NONE SEEN 0 - 5 WBC/hpf   Bacteria, UA RARE (A) NONE SEEN   Squamous Epithelial / LPF NONE SEEN 0 - 5      Assessment & Plan:   Problem List Items Addressed This Visit       Other   Depression, recurrent (HCC) - Primary    Chronic. Not well controlled.  Will start Effexor 37.5mg  daily.  Side effects and benefits of medication discussed during visit today.  Follow up in 1 month.  Call sooner if concerns arise.       Relevant Medications   venlafaxine XR (EFFEXOR XR) 37.5 MG 24 hr capsule   traZODone (DESYREL) 50 MG tablet   Anxiety    Chronic. Not well controlled.  Will start Effexor 37.5mg  daily.  Side effects and benefits of medication discussed during visit today.  Follow up in 1 month.  Call sooner if concerns arise.       Relevant Medications   venlafaxine XR (EFFEXOR XR) 37.5 MG 24 hr capsule   traZODone (DESYREL) 50 MG tablet   Other Visit Diagnoses     Sleep disturbance       Will start Trazodone nightly.  Side effects and benefits of medication discussed during visit today.  Follow up in 59month.  Call sooner if concerns arise.        Follow up plan: Return in about 1 month (around 10/14/2022) for Depression/Anxiety FU.

## 2022-09-14 NOTE — Assessment & Plan Note (Signed)
Chronic. Not well controlled. Will start Effexor 37.5mg daily.  Side effects and benefits of medication discussed during visit today.  Follow up in 1 month.  Call sooner if concerns arise.  

## 2022-10-04 NOTE — Progress Notes (Unsigned)
There were no vitals taken for this visit.   Subjective:    Patient ID: Douglas Mack, male    DOB: 05-03-1986, 36 y.o.   MRN: [id number]  HPI: [name] is a 36 y.o. male presenting on 10/05/2022 for comprehensive medical examination. Current medical complaints include:{Blank single:19197::"none","***"}  He currently lives with: Interim Problems from his last visit: {Blank single:19197::"yes","no"}  MOOD  Depression Screen done today and results listed below:     09/14/2022    2:44 PM 08/30/2022    9:10 AM  Depression screen PHQ 2/9  Decreased Interest 3 3  Down, Depressed, Hopeless 3 3  PHQ - 2 Score 6 6  Altered sleeping 3 3  Tired, decreased energy 3 3  Change in appetite 3 3  Feeling bad or failure about yourself  3 3  Trouble concentrating 3 3  Moving slowly or fidgety/restless 3 3  Suicidal thoughts 0 0  PHQ-9 Score 24 24  Difficult doing work/chores Extremely dIfficult Extremely dIfficult    The patient {has/does not have:19849} a history of falls. I {did/did not:19850} complete a risk assessment for falls. A plan of care for falls {was/was not:19852} documented.   Past Medical History:  No past medical history on file.  Surgical History:  No past surgical history on file.  Medications:  Current Outpatient Medications on File Prior to Visit  Medication Sig   cyclobenzaprine (FLEXERIL) 10 MG tablet Take 1 tablet (10 mg total) by mouth at bedtime.   traZODone (DESYREL) 50 MG tablet Take 0.5-1 tablets (25-50 mg total) by mouth at bedtime as needed for sleep.   venlafaxine XR (EFFEXOR XR) 37.5 MG 24 hr capsule Take 1 capsule (37.5 mg total) by mouth daily with breakfast.   No current facility-administered medications on file prior to visit.    Allergies:  No Known Allergies  Social History:  Social History   Socioeconomic History   Marital status: Single    Spouse name: Not on file   Number of children: Not on file   Years of education:  Not on file   Highest education level: Not on file  Occupational History   Not on file  Tobacco Use   Smoking status: Former    Types: Cigarettes   Smokeless tobacco: Never  Vaping Use   Vaping Use: Every day  Substance and Sexual Activity   Alcohol use: Yes    Comment: socially   Drug use: Never   Sexual activity: Yes  Other Topics Concern   Not on file  Social History Narrative   Not on file   Social Determinants of Health   Financial Resource Strain: Not on file  Food Insecurity: Not on file  Transportation Needs: Not on file  Physical Activity: Not on file  Stress: Not on file  Social Connections: Not on file  Intimate Partner Violence: Not on file   Social History   Tobacco Use  Smoking Status Former   Types: Cigarettes  Smokeless Tobacco Never   Social History   Substance and Sexual Activity  Alcohol Use Yes   Comment: socially    Family History:  Family History  Problem Relation Age of Onset   Thyroid disease Mother    Hypertension Father     Past medical history, surgical history, medications, allergies, family history and social history reviewed with patient today and changes made to appropriate areas of the chart.   ROS All other ROS negative except what is listed above and in  the HPI.      Objective:    There were no vitals taken for this visit.  Wt Readings from Last 3 Encounters:  09/14/22 (!) 316 lb 1.6 oz (143.4 kg)  08/30/22 (!) 314 lb 11.2 oz (142.7 kg)  03/27/22 (!) 313 lb (142 kg)    Physical Exam  Results for orders placed or performed during the hospital encounter of 03/27/22  Urinalysis, Routine w reflex microscopic Urine, Clean Catch  Result Value Ref Range   Color, Urine YELLOW YELLOW   APPearance CLEAR CLEAR   Specific Gravity, Urine 1.020 1.005 - 1.030   pH 7.5 5.0 - 8.0   Glucose, UA NEGATIVE NEGATIVE mg/dL   Hgb urine dipstick SMALL (A) NEGATIVE   Bilirubin Urine NEGATIVE NEGATIVE   Ketones, ur NEGATIVE NEGATIVE  mg/dL   Protein, ur NEGATIVE NEGATIVE mg/dL   Nitrite NEGATIVE NEGATIVE   Leukocytes,Ua NEGATIVE NEGATIVE  Urinalysis, Microscopic (reflex)  Result Value Ref Range   RBC / HPF 21-50 0 - 5 RBC/hpf   WBC, UA NONE SEEN 0 - 5 WBC/hpf   Bacteria, UA RARE (A) NONE SEEN   Squamous Epithelial / LPF NONE SEEN 0 - 5      Assessment & Plan:   Problem List Items Addressed This Visit       Other   Depression, recurrent (HCC)   Anxiety - Primary     Discussed aspirin prophylaxis for myocardial infarction prevention and decision was {Blank single:19197::"it was not indicated","made to continue ASA","made to start ASA","made to stop ASA","that we recommended ASA, and patient refused"}  LABORATORY TESTING:  Health maintenance labs ordered today as discussed above.   The natural history of prostate cancer and ongoing controversy regarding screening and potential treatment outcomes of prostate cancer has been discussed with the patient. The meaning of a false positive PSA and a false negative PSA has been discussed. He indicates understanding of the limitations of this screening test and wishes *** to proceed with screening PSA testing.   IMMUNIZATIONS:   - Tdap: Tetanus vaccination status reviewed: {tetanus status:315746}. - Influenza: {Blank single:19197::"Up to date","Administered today","Postponed to flu season","Refused","Given elsewhere"} - Pneumovax: {Blank single:19197::"Up to date","Administered today","Not applicable","Refused","Given elsewhere"} - Prevnar: {Blank single:19197::"Up to date","Administered today","Not applicable","Refused","Given elsewhere"} - COVID: {Blank single:19197::"Up to date","Administered today","Not applicable","Refused","Given elsewhere"} - HPV: {Blank single:19197::"Up to date","Administered today","Not applicable","Refused","Given elsewhere"} - Shingrix vaccine: {Blank single:19197::"Up to date","Administered today","Not applicable","Refused","Given  elsewhere"}  SCREENING: - Colonoscopy: {Blank single:19197::"Up to date","Ordered today","Not applicable","Refused","Done elsewhere"}  Discussed with patient purpose of the colonoscopy is to detect colon cancer at curable precancerous or early stages   - AAA Screening: {Blank single:19197::"Up to date","Ordered today","Not applicable","Refused","Done elsewhere"}  -Hearing Test: {Blank single:19197::"Up to date","Ordered today","Not applicable","Refused","Done elsewhere"}  -Spirometry: {Blank single:19197::"Up to date","Ordered today","Not applicable","Refused","Done elsewhere"}   PATIENT COUNSELING:    Sexuality: Discussed sexually transmitted diseases, partner selection, use of condoms, avoidance of unintended pregnancy  and contraceptive alternatives.   Advised to avoid cigarette smoking.  I discussed with the patient that most people either abstain from alcohol or drink within safe limits (<=14/week and <=4 drinks/occasion for males, <=7/weeks and <= 3 drinks/occasion for females) and that the risk for alcohol disorders and other health effects rises proportionally with the number of drinks per week and how often a drinker exceeds daily limits.  Discussed cessation/primary prevention of drug use and availability of treatment for abuse.   Diet: Encouraged to adjust caloric intake to maintain  or achieve ideal body weight, to reduce intake of dietary  saturated fat and total fat, to limit sodium intake by avoiding high sodium foods and not adding table salt, and to maintain adequate dietary potassium and calcium preferably from fresh fruits, vegetables, and low-fat dairy products.    stressed the importance of regular exercise  Injury prevention: Discussed safety belts, safety helmets, smoke detector, smoking near bedding or upholstery.   Dental health: Discussed importance of regular tooth brushing, flossing, and dental visits.   Follow up plan: NEXT PREVENTATIVE PHYSICAL DUE IN 1  YEAR. No follow-ups on file.

## 2022-10-05 ENCOUNTER — Ambulatory Visit (INDEPENDENT_AMBULATORY_CARE_PROVIDER_SITE_OTHER): Payer: BC Managed Care – PPO | Admitting: Nurse Practitioner

## 2022-10-05 ENCOUNTER — Encounter: Payer: Self-pay | Admitting: Nurse Practitioner

## 2022-10-05 VITALS — BP 135/84 | HR 78 | Temp 98.5°F | Ht 76.25 in | Wt 309.8 lb

## 2022-10-05 DIAGNOSIS — Z136 Encounter for screening for cardiovascular disorders: Secondary | ICD-10-CM | POA: Diagnosis not present

## 2022-10-05 DIAGNOSIS — F339 Major depressive disorder, recurrent, unspecified: Secondary | ICD-10-CM

## 2022-10-05 DIAGNOSIS — Z6837 Body mass index (BMI) 37.0-37.9, adult: Secondary | ICD-10-CM

## 2022-10-05 DIAGNOSIS — Z1159 Encounter for screening for other viral diseases: Secondary | ICD-10-CM

## 2022-10-05 DIAGNOSIS — F419 Anxiety disorder, unspecified: Secondary | ICD-10-CM

## 2022-10-05 DIAGNOSIS — N23 Unspecified renal colic: Secondary | ICD-10-CM | POA: Insufficient documentation

## 2022-10-05 DIAGNOSIS — Z0000 Encounter for general adult medical examination without abnormal findings: Secondary | ICD-10-CM

## 2022-10-05 DIAGNOSIS — Z114 Encounter for screening for human immunodeficiency virus [HIV]: Secondary | ICD-10-CM

## 2022-10-05 LAB — URINALYSIS, ROUTINE W REFLEX MICROSCOPIC
Bilirubin, UA: NEGATIVE
Glucose, UA: NEGATIVE
Ketones, UA: NEGATIVE
Leukocytes,UA: NEGATIVE
Nitrite, UA: NEGATIVE
Protein,UA: NEGATIVE
RBC, UA: NEGATIVE
Specific Gravity, UA: 1.025 (ref 1.005–1.030)
Urobilinogen, Ur: 1 mg/dL (ref 0.2–1.0)
pH, UA: 5.5 (ref 5.0–7.5)

## 2022-10-05 MED ORDER — TRAZODONE HCL 100 MG PO TABS: 100.0000 mg | ORAL_TABLET | Freq: Every evening | ORAL | 0 refills | Status: AC | PRN

## 2022-10-05 MED ORDER — VENLAFAXINE HCL ER 75 MG PO CP24: 75.0000 mg | ORAL_CAPSULE | Freq: Every day | ORAL | 0 refills | Status: AC

## 2022-10-05 NOTE — Assessment & Plan Note (Signed)
Chronic. Not well controlled.  Will increase dose to Effexor 75mg  daily.  Follow up in 2 weeks.  Will also increase Trazadone to 100mg  nightly.  If not improvement at next visit will change to different medication such as duloxetine.  Call sooner if concerns arise.

## 2022-10-05 NOTE — Assessment & Plan Note (Signed)
Recommended eating smaller high protein, low fat meals more frequently and exercising 30 mins a day 5 times a week with a goal of 10-15lb weight loss in the next 3 months. Patient voiced their understanding and motivation to adhere to these recommendations.  

## 2022-10-05 NOTE — Assessment & Plan Note (Addendum)
Chronic. Not well controlled.  Will increase dose to Effexor 75mg daily.  Follow up in 2 weeks.  Will also increase Trazadone to 100mg nightly.  If not improvement at next visit will change to different medication such as duloxetine.  Call sooner if concerns arise.  

## 2022-10-06 LAB — LIPID PANEL
Chol/HDL Ratio: 3.8 ratio (ref 0.0–5.0)
Cholesterol, Total: 178 mg/dL (ref 100–199)
HDL: 47 mg/dL (ref >39)
LDL Chol Calc (NIH): 108 mg/dL — ABNORMAL HIGH (ref 0–99)
Triglycerides: 129 mg/dL (ref 0–149)
VLDL Cholesterol Cal: 23 mg/dL (ref 5–40)

## 2022-10-06 LAB — COMPREHENSIVE METABOLIC PANEL
ALT: 49 IU/L — ABNORMAL HIGH (ref 0–44)
AST: 24 IU/L (ref 0–40)
Albumin/Globulin Ratio: 2 (ref 1.2–2.2)
Albumin: 4.9 g/dL (ref 4.1–5.1)
Alkaline Phosphatase: 72 IU/L (ref 44–121)
BUN/Creatinine Ratio: 12 (ref 9–20)
BUN: 12 mg/dL (ref 6–20)
Bilirubin Total: 0.4 mg/dL (ref 0.0–1.2)
CO2: 22 mmol/L (ref 20–29)
Calcium: 9.5 mg/dL (ref 8.7–10.2)
Chloride: 105 mmol/L (ref 96–106)
Creatinine, Ser: 1.04 mg/dL (ref 0.76–1.27)
Globulin, Total: 2.4 g/dL (ref 1.5–4.5)
Glucose: 102 mg/dL — ABNORMAL HIGH (ref 70–99)
Potassium: 4.1 mmol/L (ref 3.5–5.2)
Sodium: 140 mmol/L (ref 134–144)
Total Protein: 7.3 g/dL (ref 6.0–8.5)
eGFR: 95 mL/min/{1.73_m2} (ref >59)

## 2022-10-06 LAB — CBC WITH DIFFERENTIAL/PLATELET
Basophils Absolute: 0.1 10*3/uL (ref 0.0–0.2)
Basos: 1 %
EOS (ABSOLUTE): 0.2 10*3/uL (ref 0.0–0.4)
Eos: 2 %
Hematocrit: 45 % (ref 37.5–51.0)
Hemoglobin: 15.7 g/dL (ref 13.0–17.7)
Immature Grans (Abs): 0 10*3/uL (ref 0.0–0.1)
Immature Granulocytes: 0 %
Lymphocytes Absolute: 3.1 10*3/uL (ref 0.7–3.1)
Lymphs: 41 %
MCH: 30 pg (ref 26.6–33.0)
MCHC: 34.9 g/dL (ref 31.5–35.7)
MCV: 86 fL (ref 79–97)
Monocytes Absolute: 0.4 10*3/uL (ref 0.1–0.9)
Monocytes: 6 %
Neutrophils Absolute: 3.8 10*3/uL (ref 1.4–7.0)
Neutrophils: 50 %
Platelets: 257 10*3/uL (ref 150–450)
RBC: 5.23 x10E6/uL (ref 4.14–5.80)
RDW: 13.1 % (ref 11.6–15.4)
WBC: 7.5 10*3/uL (ref 3.4–10.8)

## 2022-10-06 LAB — HEPATITIS C ANTIBODY: Hep C Virus Ab: NONREACTIVE

## 2022-10-06 LAB — TSH: TSH: 2 u[IU]/mL (ref 0.450–4.500)

## 2022-10-06 LAB — HIV ANTIBODY (ROUTINE TESTING W REFLEX): HIV Screen 4th Generation wRfx: NONREACTIVE

## 2022-10-06 NOTE — Progress Notes (Signed)
Hi Douglas Mack. It was nice to see you yesterday.  Your lab work looks good.  No concerns at this time. Continue with your current medication regimen.  Follow up as discussed.  Please let me know if you have any questions.

## 2022-10-28 NOTE — Progress Notes (Deleted)
There were no vitals taken for this visit.   Subjective:    Patient ID: Douglas Mack, male    DOB: October 17, 1986, 36 y.o.   MRN: [id number]  HPI: Douglas Mack is a 36 y.o. male  No chief complaint on file.  MOOD Patient states he hasn't seen any difference with the medication.  Everyday is a task to get out of the bed.  He is still having trouble sleeping.  He feels like he is only sleeping for about 30 minutes every night.  Denies SI.   Relevant past medical, surgical, family and social history reviewed and updated as indicated. Interim medical history since our last visit reviewed. Allergies and medications reviewed and updated.  Review of Systems  Per HPI unless specifically indicated above     Objective:    There were no vitals taken for this visit.  Wt Readings from Last 3 Encounters:  10/05/22 (!) 309 lb 12.8 oz (140.5 kg)  09/14/22 (!) 316 lb 1.6 oz (143.4 kg)  08/30/22 (!) 314 lb 11.2 oz (142.7 kg)    Physical Exam  Results for orders placed or performed in visit on 10/05/22  TSH  Result Value Ref Range   TSH 2.000 0.450 - 4.500 uIU/mL  Lipid panel  Result Value Ref Range   Cholesterol, Total 178 100 - 199 mg/dL   Triglycerides 129 0 - 149 mg/dL   HDL 47 >39 mg/dL   VLDL Cholesterol Cal 23 5 - 40 mg/dL   LDL Chol Calc (NIH) 108 (H) 0 - 99 mg/dL   Chol/HDL Ratio 3.8 0.0 - 5.0 ratio  CBC with Differential/Platelet  Result Value Ref Range   WBC 7.5 3.4 - 10.8 x10E3/uL   RBC 5.23 4.14 - 5.80 x10E6/uL   Hemoglobin 15.7 13.0 - 17.7 g/dL   Hematocrit 45.0 37.5 - 51.0 %   MCV 86 79 - 97 fL   MCH 30.0 26.6 - 33.0 pg   MCHC 34.9 31.5 - 35.7 g/dL   RDW 13.1 11.6 - 15.4 %   Platelets 257 150 - 450 x10E3/uL   Neutrophils 50 Not Estab. %   Lymphs 41 Not Estab. %   Monocytes 6 Not Estab. %   Eos 2 Not Estab. %   Basos 1 Not Estab. %   Neutrophils Absolute 3.8 1.4 - 7.0 x10E3/uL   Lymphocytes Absolute 3.1 0.7 - 3.1 x10E3/uL   Monocytes Absolute 0.4 0.1 -  0.9 x10E3/uL   EOS (ABSOLUTE) 0.2 0.0 - 0.4 x10E3/uL   Basophils Absolute 0.1 0.0 - 0.2 x10E3/uL   Immature Granulocytes 0 Not Estab. %   Immature Grans (Abs) 0.0 0.0 - 0.1 x10E3/uL  Comprehensive metabolic panel  Result Value Ref Range   Glucose 102 (H) 70 - 99 mg/dL   BUN 12 6 - 20 mg/dL   Creatinine, Ser 1.04 0.76 - 1.27 mg/dL   eGFR 95 >59 mL/min/1.73   BUN/Creatinine Ratio 12 9 - 20   Sodium 140 134 - 144 mmol/L   Potassium 4.1 3.5 - 5.2 mmol/L   Chloride 105 96 - 106 mmol/L   CO2 22 20 - 29 mmol/L   Calcium 9.5 8.7 - 10.2 mg/dL   Total Protein 7.3 6.0 - 8.5 g/dL   Albumin 4.9 4.1 - 5.1 g/dL   Globulin, Total 2.4 1.5 - 4.5 g/dL   Albumin/Globulin Ratio 2.0 1.2 - 2.2   Bilirubin Total 0.4 0.0 - 1.2 mg/dL   Alkaline Phosphatase 72 44 - 121 IU/L  AST 24 0 - 40 IU/L   ALT 49 (H) 0 - 44 IU/L  Urinalysis, Routine w reflex microscopic  Result Value Ref Range   Specific Gravity, UA 1.025 1.005 - 1.030   pH, UA 5.5 5.0 - 7.5   Color, UA Yellow Yellow   Appearance Ur Clear Clear   Leukocytes,UA Negative Negative   Protein,UA Negative Negative/Trace   Glucose, UA Negative Negative   Ketones, UA Negative Negative   RBC, UA Negative Negative   Bilirubin, UA Negative Negative   Urobilinogen, Ur 1.0 0.2 - 1.0 mg/dL   Nitrite, UA Negative Negative   Microscopic Examination Comment   HIV Antibody (routine testing w rflx)  Result Value Ref Range   HIV Screen 4th Generation wRfx Non Reactive Non Reactive  Hepatitis C Antibody  Result Value Ref Range   Hep C Virus Ab Non Reactive Non Reactive      Assessment & Plan:   Problem List Items Addressed This Visit   None    Follow up plan: No follow-ups on file.

## 2022-10-29 ENCOUNTER — Ambulatory Visit: Payer: BC Managed Care – PPO | Admitting: Nurse Practitioner

## 2022-11-10 ENCOUNTER — Other Ambulatory Visit: Payer: Self-pay | Admitting: Nurse Practitioner

## 2022-11-10 NOTE — Telephone Encounter (Signed)
Patient called, left VM to return the call to the office to scheduled an appt for medication refill request. Pt was suppose to have 2 week FU appt after OV on 10/05/22.

## 2022-11-10 NOTE — Telephone Encounter (Signed)
Rx doses were increased at [place] on 10/05/22 #60/0.  Requested Prescriptions  Pending Prescriptions Disp Refills   venlafaxine XR (EFFEXOR-XR) 37.5 MG 24 hr capsule [Pharmacy Med Name: VENLAFAXINE HCL ER 37.5 MG CAP] 60 capsule 0    Sig: TAKE 1 CAPSULE BY MOUTH DAILY WITH BREAKFAST.     Psychiatry: Antidepressants - SNRI - desvenlafaxine & venlafaxine Failed - 11/10/2022  1:24 AM      Failed - Lipid Panel in normal range within the last 12 months    Cholesterol, Total  Date Value Ref Range Status  10/05/2022 178 100 - 199 mg/dL Final   LDL Chol Calc (NIH)  Date Value Ref Range Status  10/05/2022 108 (H) 0 - 99 mg/dL Final   HDL  Date Value Ref Range Status  10/05/2022 47 >39 mg/dL Final   Triglycerides  Date Value Ref Range Status  10/05/2022 129 0 - 149 mg/dL Final         Passed - Cr in normal range and within 360 days    Creatinine, Ser  Date Value Ref Range Status  10/05/2022 1.04 0.76 - 1.27 mg/dL Final         Passed - Completed PHQ-2 or PHQ-9 in the last 360 days      Passed - Last BP in normal range    BP Readings from Last 1 Encounters:  10/05/22 135/84         Passed - Valid encounter within last 6 months    Recent Outpatient Visits           1 month ago Annual physical exam   [place], NP   1 month ago Depression, recurrent ([place])   [facility] Jon Billings, NP   2 months ago Depression, recurrent ([place])   [facility] Jon Billings, NP               traZODone (DESYREL) 50 MG tablet [Pharmacy Med Name: TRAZODONE 50 MG TABLET] 60 tablet 0    Sig: TAKE 0.5-1 TABLETS BY MOUTH AT BEDTIME AS NEEDED FOR SLEEP.     Psychiatry: Antidepressants - Serotonin Modulator Passed - 11/10/2022  1:24 AM      Passed - Completed PHQ-2 or PHQ-9 in the last 360 days      Passed - Valid encounter within last 6 months    Recent Outpatient Visits           1 month ago Annual physical exam   [place], NP   1 month ago Depression, recurrent [facility])   [facility] Jon Billings, NP   2 months ago Depression, recurrent [facility])   [facility] Jon Billings, NP

## 2023-10-27 ENCOUNTER — Institutional Professional Consult (permissible substitution): Payer: No Typology Code available for payment source | Admitting: Neurology

## 2023-11-17 ENCOUNTER — Institutional Professional Consult (permissible substitution): Payer: No Typology Code available for payment source | Admitting: Neurology

## 2024-05-03 DIAGNOSIS — M9902 Segmental and somatic dysfunction of thoracic region: Secondary | ICD-10-CM | POA: Diagnosis not present

## 2024-05-03 DIAGNOSIS — M5412 Radiculopathy, cervical region: Secondary | ICD-10-CM | POA: Diagnosis not present

## 2024-05-03 DIAGNOSIS — M9901 Segmental and somatic dysfunction of cervical region: Secondary | ICD-10-CM | POA: Diagnosis not present

## 2024-05-03 DIAGNOSIS — R519 Headache, unspecified: Secondary | ICD-10-CM | POA: Diagnosis not present

## 2024-05-04 DIAGNOSIS — R519 Headache, unspecified: Secondary | ICD-10-CM | POA: Diagnosis not present

## 2024-05-04 DIAGNOSIS — M5412 Radiculopathy, cervical region: Secondary | ICD-10-CM | POA: Diagnosis not present

## 2024-05-04 DIAGNOSIS — M9901 Segmental and somatic dysfunction of cervical region: Secondary | ICD-10-CM | POA: Diagnosis not present

## 2024-05-04 DIAGNOSIS — M9902 Segmental and somatic dysfunction of thoracic region: Secondary | ICD-10-CM | POA: Diagnosis not present

## 2024-05-07 ENCOUNTER — Emergency Department

## 2024-05-07 ENCOUNTER — Emergency Department
Admission: EM | Admit: 2024-05-07 | Discharge: 2024-05-07 | Disposition: A | Discharge status: Home / Self Care | Attending: Emergency Medicine | Admitting: Emergency Medicine

## 2024-05-07 ENCOUNTER — Other Ambulatory Visit: Payer: Self-pay

## 2024-05-07 ENCOUNTER — Encounter: Payer: Self-pay | Admitting: *Deleted

## 2024-05-07 DIAGNOSIS — R0781 Pleurodynia: Secondary | ICD-10-CM | POA: Diagnosis not present

## 2024-05-07 DIAGNOSIS — R0789 Other chest pain: Secondary | ICD-10-CM | POA: Insufficient documentation

## 2024-05-07 DIAGNOSIS — M549 Dorsalgia, unspecified: Secondary | ICD-10-CM | POA: Diagnosis not present

## 2024-05-07 DIAGNOSIS — M546 Pain in thoracic spine: Secondary | ICD-10-CM | POA: Diagnosis not present

## 2024-05-07 DIAGNOSIS — M7918 Myalgia, other site: Secondary | ICD-10-CM

## 2024-05-07 DIAGNOSIS — Y92410 Unspecified street and highway as the place of occurrence of the external cause: Secondary | ICD-10-CM | POA: Diagnosis not present

## 2024-05-07 MED ORDER — IBUPROFEN 600 MG PO TABS
600.0000 mg | ORAL_TABLET | Freq: Once | ORAL | Status: AC
  Administered 2024-05-07: 600 mg via ORAL
  Filled 2024-05-07: qty 1

## 2024-05-07 MED ORDER — OXYCODONE-ACETAMINOPHEN 5-325 MG PO TABS
1.0000 | ORAL_TABLET | Freq: Once | ORAL | Status: AC
  Administered 2024-05-07: 1 via ORAL
  Filled 2024-05-07: qty 1

## 2024-05-07 MED ORDER — MELOXICAM 15 MG PO TABS: 15.0000 mg | ORAL_TABLET | Freq: Every day | ORAL | 0 refills | Status: AC
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
## 2024-07-13 ENCOUNTER — Ambulatory Visit: Admitting: Nurse Practitioner

## 2024-07-13 NOTE — Progress Notes (Deleted)
 There were no vitals taken for this visit.   Subjective:    Patient ID: Douglas Mack, male    DOB: Apr 18, 1986, 38 y.o.   MRN: [id number]  HPI: Brach Douglas Mack is a 38 y.o. male  No chief complaint on file.   Relevant past medical, surgical, family and social history reviewed and updated as indicated. Interim medical history since our last visit reviewed. Allergies and medications reviewed and updated.  Review of Systems  Per HPI unless specifically indicated above     Objective:    There were no vitals taken for this visit.  Wt Readings from Last 3 Encounters:  05/07/24 (!) 315 lb (142.9 kg)  10/05/22 (!) 309 lb 12.8 oz (140.5 kg)  09/14/22 (!) 316 lb 1.6 oz (143.4 kg)    Physical Exam  Results for orders placed or performed in visit on 10/05/22  Urinalysis, Routine w reflex microscopic   Collection Time: 10/05/22  9:46 AM  Result Value Ref Range   Specific Gravity, UA 1.025 1.005 - 1.030   pH, UA 5.5 5.0 - 7.5   Color, UA Yellow Yellow   Appearance Ur Clear Clear   Leukocytes,UA Negative Negative   Protein,UA Negative Negative/Trace   Glucose, UA Negative Negative   Ketones, UA Negative Negative   RBC, UA Negative Negative   Bilirubin, UA Negative Negative   Urobilinogen, Ur 1.0 0.2 - 1.0 mg/dL   Nitrite, UA Negative Negative   Microscopic Examination Comment   TSH   Collection Time: 10/05/22  9:49 AM  Result Value Ref Range   TSH 2.000 0.450 - 4.500 uIU/mL  Lipid panel   Collection Time: 10/05/22  9:49 AM  Result Value Ref Range   Cholesterol, Total 178 100 - 199 mg/dL   Triglycerides 870 0 - 149 mg/dL   HDL 47 >60 mg/dL   VLDL Cholesterol Cal 23 5 - 40 mg/dL   LDL Chol Calc (NIH) 891 (H) 0 - 99 mg/dL   Chol/HDL Ratio 3.8 0.0 - 5.0 ratio  CBC with Differential/Platelet   Collection Time: 10/05/22  9:49 AM  Result Value Ref Range   WBC 7.5 3.4 - 10.8 x10E3/uL   RBC 5.23 4.14 - 5.80 x10E6/uL   Hemoglobin 15.7 13.0 - 17.7 g/dL   Hematocrit 54.9  62.4 - 51.0 %   MCV 86 79 - 97 fL   MCH 30.0 26.6 - 33.0 pg   MCHC 34.9 31.5 - 35.7 g/dL   RDW 86.8 88.3 - 84.5 %   Platelets 257 150 - 450 x10E3/uL   Neutrophils 50 Not Estab. %   Lymphs 41 Not Estab. %   Monocytes 6 Not Estab. %   Eos 2 Not Estab. %   Basos 1 Not Estab. %   Neutrophils Absolute 3.8 1.4 - 7.0 x10E3/uL   Lymphocytes Absolute 3.1 0.7 - 3.1 x10E3/uL   Monocytes Absolute 0.4 0.1 - 0.9 x10E3/uL   EOS (ABSOLUTE) 0.2 0.0 - 0.4 x10E3/uL   Basophils Absolute 0.1 0.0 - 0.2 x10E3/uL   Immature Granulocytes 0 Not Estab. %   Immature Grans (Abs) 0.0 0.0 - 0.1 x10E3/uL  Comprehensive metabolic panel   Collection Time: 10/05/22  9:49 AM  Result Value Ref Range   Glucose 102 (H) 70 - 99 mg/dL   BUN 12 6 - 20 mg/dL   Creatinine, Ser 8.95 0.76 - 1.27 mg/dL   eGFR 95 >40 fO/fpw/8.26   BUN/Creatinine Ratio 12 9 - 20   Sodium 140 134 - 144  mmol/L   Potassium 4.1 3.5 - 5.2 mmol/L   Chloride 105 96 - 106 mmol/L   CO2 22 20 - 29 mmol/L   Calcium 9.5 8.7 - 10.2 mg/dL   Total Protein 7.3 6.0 - 8.5 g/dL   Albumin 4.9 4.1 - 5.1 g/dL   Globulin, Total 2.4 1.5 - 4.5 g/dL   Albumin/Globulin Ratio 2.0 1.2 - 2.2   Bilirubin Total 0.4 0.0 - 1.2 mg/dL   Alkaline Phosphatase 72 44 - 121 IU/L   AST 24 0 - 40 IU/L   ALT 49 (H) 0 - 44 IU/L  HIV Antibody (routine testing w rflx)   Collection Time: 10/05/22  9:49 AM  Result Value Ref Range   HIV Screen 4th Generation wRfx Non Reactive Non Reactive  Hepatitis C Antibody   Collection Time: 10/05/22  9:49 AM  Result Value Ref Range   Hep C Virus Ab Non Reactive Non Reactive      Assessment & Plan:   Problem List Items Addressed This Visit   None    Follow up plan: No follow-ups on file.
